# Patient Record
Sex: Male | Born: 1995 | Race: White | Hispanic: No | Marital: Single | Smoking: Never smoker
Health system: Southern US, Community
[De-identification: ages and names within clinical notes are randomized; demographics above are authoritative.]

---

## 2016-07-10 ENCOUNTER — Encounter (HOSPITAL_COMMUNITY): Payer: Self-pay | Admitting: Emergency Medicine

## 2016-07-10 ENCOUNTER — Emergency Department (HOSPITAL_COMMUNITY)
Admission: EM | Admit: 2016-07-10 | Discharge: 2016-07-10 | Disposition: A | Discharge status: Home / Self Care | Payer: Self-pay | Attending: Emergency Medicine | Admitting: Emergency Medicine

## 2016-07-10 DIAGNOSIS — R55 Syncope and collapse: Secondary | ICD-10-CM | POA: Insufficient documentation

## 2016-07-10 LAB — CBG MONITORING, ED: Glucose-Capillary: 88 mg/dL (ref 65–99)

## 2016-07-10 LAB — BASIC METABOLIC PANEL
ANION GAP: 4 — AB (ref 5–15)
BUN: 17 mg/dL (ref 6–20)
CO2: 27 mmol/L (ref 22–32)
Calcium: 9.2 mg/dL (ref 8.9–10.3)
Chloride: 107 mmol/L (ref 101–111)
Creatinine, Ser: 1 mg/dL (ref 0.61–1.24)
GFR calc Af Amer: >60 mL/min (ref >60)
GFR calc non Af Amer: >60 mL/min (ref >60)
GLUCOSE: 113 mg/dL — AB (ref 65–99)
POTASSIUM: 4.1 mmol/L (ref 3.5–5.1)
Sodium: 138 mmol/L (ref 135–145)

## 2016-07-10 LAB — CBC
HEMATOCRIT: 45.7 % (ref 39.0–52.0)
HEMOGLOBIN: 15.9 g/dL (ref 13.0–17.0)
MCH: 29.6 pg (ref 26.0–34.0)
MCHC: 34.8 g/dL (ref 30.0–36.0)
MCV: 85.1 fL (ref 78.0–100.0)
Platelets: 200 10*3/uL (ref 150–400)
RBC: 5.37 MIL/uL (ref 4.22–5.81)
RDW: 12.9 % (ref 11.5–15.5)
WBC: 7.6 10*3/uL (ref 4.0–10.5)

## 2016-07-10 NOTE — ED Provider Notes (Signed)
[facility] DEPT Provider Note   CSN: [id number] Arrival date & time: 07/10/16  0758     History   Chief Complaint Chief Complaint  Patient presents with  . Loss of Consciousness    HPI Joe Ashley is a 21 y.o. male.  Patient presents to the ED with a chief complaint of syncope.  He states that he got up this morning to use the bathroom at about 5:30am, took a few steps, became dizzy, and passed out.  He denies having any pain anywhere.  He states that he still feels a little "shaky."  He denies any chest pain or SOB.  He denies any prior medical problems, denies taking any medications, or using any alcohol or recreational drugs.  There are no other associated symptoms.   The history is provided by the patient. No language interpreter was used.    History reviewed. No pertinent past medical history.  There are no active problems to display for this patient.   History reviewed. No pertinent surgical history.     Home Medications    Prior to Admission medications   Not on File    Family History No family history on file.  Social History Social History  Substance Use Topics  . Smoking status: Never Smoker  . Smokeless tobacco: Never Used  . Alcohol use No     Allergies   Bactrim [sulfamethoxazole-trimethoprim]   Review of Systems Review of Systems  Neurological: Positive for syncope.  All other systems reviewed and are negative.    Physical Exam Updated Vital Signs BP 140/64 (BP Location: Right Arm)   Pulse 74   Temp 97.7 F (36.5 C) (Oral)   Resp 20   Ht 6\' 1"  (1.854 m)   Wt 104.3 kg   SpO2 100%   BMI 30.34 kg/m   Physical Exam  Constitutional: He is oriented to person, place, and time. He appears well-developed and well-nourished.  HENT:  Head: Normocephalic and atraumatic.  Eyes: Conjunctivae and EOM are normal. Pupils are equal, round, and reactive to light. Right eye exhibits no discharge. Left eye exhibits no discharge. No scleral  icterus.  Neck: Normal range of motion. Neck supple. No JVD present.  Cardiovascular: Normal rate, regular rhythm and normal heart sounds.  Exam reveals no gallop and no friction rub.   No murmur heard. Pulmonary/Chest: Effort normal and breath sounds normal. No respiratory distress. He has no wheezes. He has no rales. He exhibits no tenderness.  Abdominal: Soft. He exhibits no distension and no mass. There is no tenderness. There is no rebound and no guarding.  Musculoskeletal: Normal range of motion. He exhibits no edema or tenderness.  Neurological: He is alert and oriented to person, place, and time.  Skin: Skin is warm and dry.  Psychiatric: He has a normal mood and affect. His behavior is normal. Judgment and thought content normal.  Nursing note and vitals reviewed.    ED Treatments / Results  Labs (all labs ordered are listed, but only abnormal results are displayed) Labs Reviewed  BASIC METABOLIC PANEL  CBC  CBG MONITORING, ED    EKG  EKG Interpretation None       Radiology No results found.  Procedures Procedures (including critical care time)  Medications Ordered in ED Medications - No data to display   Initial Impression / Assessment and Plan / ED Course  I have reviewed the triage vital signs and the nursing notes.  Pertinent labs & imaging results that were available during  my care of the patient were reviewed by me and considered in my medical decision making (see chart for details).  Clinical Course     Patient with syncopal episode this morning.  Reports having passed out as a child, but nothing recently.  He states he feels improved, but still a little "shaky."  Will check CBG, EKG, CBC, and BMP.  Will also check orthostatics.   Based on history seems to be more of a vasovagul/orthostatic response to having just gotten up to use the bathroom.  Labs and EKG are reassuring.  EKG reviewed with Dr. Particia Nearing.  Mildly orthostatic.  Given oral fluids in  ED.  Ambulates without difficulty.  DC to home with PCP follow-up.    Final Clinical Impressions(s) / ED Diagnoses   Final diagnoses:  Syncope, unspecified syncope type    New Prescriptions New Prescriptions   No medications on file     Aaran Enberg, PA-C 07/10/16 1610    Jacalyn Lefevre, MD 07/10/16 1104

## 2016-07-10 NOTE — ED Notes (Signed)
Pt walked two laps around nursing station. No complaints. VSS.

## 2016-07-10 NOTE — ED Notes (Signed)
Pt stated that when in the bathroom, Mother caught him falling but pt never hit the floor. No injuries noted.

## 2016-07-10 NOTE — ED Triage Notes (Signed)
Pt got up around 5:30 this am to go to the bathroom and passed out. Feels dizzy and shakey

## 2016-12-30 ENCOUNTER — Emergency Department (HOSPITAL_COMMUNITY)
Admission: EM | Admit: 2016-12-30 | Discharge: 2016-12-31 | Disposition: A | Discharge status: Home / Self Care | Payer: Self-pay | Attending: Emergency Medicine | Admitting: Emergency Medicine

## 2016-12-30 ENCOUNTER — Emergency Department (HOSPITAL_COMMUNITY): Payer: Self-pay

## 2016-12-30 ENCOUNTER — Encounter (HOSPITAL_COMMUNITY): Payer: Self-pay | Admitting: Emergency Medicine

## 2016-12-30 DIAGNOSIS — R0789 Other chest pain: Secondary | ICD-10-CM | POA: Insufficient documentation

## 2016-12-30 LAB — URINALYSIS, ROUTINE W REFLEX MICROSCOPIC
Bacteria, UA: NONE SEEN
Bilirubin Urine: NEGATIVE
GLUCOSE, UA: NEGATIVE mg/dL
Hgb urine dipstick: NEGATIVE
Ketones, ur: 5 mg/dL — AB
Leukocytes, UA: NEGATIVE
Nitrite: NEGATIVE
PH: 5 (ref 5.0–8.0)
Protein, ur: 30 mg/dL — AB
Specific Gravity, Urine: 1.033 — ABNORMAL HIGH (ref 1.005–1.030)

## 2016-12-30 LAB — CBC
HCT: 44 % (ref 39.0–52.0)
Hemoglobin: 15.3 g/dL (ref 13.0–17.0)
MCH: 29.2 pg (ref 26.0–34.0)
MCHC: 34.8 g/dL (ref 30.0–36.0)
MCV: 84 fL (ref 78.0–100.0)
PLATELETS: 206 10*3/uL (ref 150–400)
RBC: 5.24 MIL/uL (ref 4.22–5.81)
RDW: 12.8 % (ref 11.5–15.5)
WBC: 8.6 10*3/uL (ref 4.0–10.5)

## 2016-12-30 LAB — COMPREHENSIVE METABOLIC PANEL
ALT: 22 U/L (ref 17–63)
AST: 18 U/L (ref 15–41)
Albumin: 4.8 g/dL (ref 3.5–5.0)
Alkaline Phosphatase: 57 U/L (ref 38–126)
Anion gap: 7 (ref 5–15)
BUN: 15 mg/dL (ref 6–20)
CALCIUM: 9.3 mg/dL (ref 8.9–10.3)
CHLORIDE: 104 mmol/L (ref 101–111)
CO2: 27 mmol/L (ref 22–32)
CREATININE: 1 mg/dL (ref 0.61–1.24)
Glucose, Bld: 94 mg/dL (ref 65–99)
Potassium: 4.1 mmol/L (ref 3.5–5.1)
Sodium: 138 mmol/L (ref 135–145)
Total Bilirubin: 0.8 mg/dL (ref 0.3–1.2)
Total Protein: 7.4 g/dL (ref 6.5–8.1)

## 2016-12-30 LAB — LIPASE, BLOOD: LIPASE: 23 U/L (ref 11–51)

## 2016-12-30 MED ORDER — GI COCKTAIL ~~LOC~~
30.0000 mL | Freq: Once | ORAL | Status: AC
  Administered 2016-12-30: 30 mL via ORAL
  Filled 2016-12-30: qty 30

## 2016-12-30 NOTE — ED Provider Notes (Signed)
[facility] Provider Note   CSN: [id number] Arrival date & time: 12/30/16  1820     History   Chief Complaint Chief Complaint  Patient presents with  . Abdominal Pain    HPI Joe Ashley is a 21 y.o. male.  Patient presents with a 2 day history of constant lower chest and upper abdominal pain. Does not radiate. States it started 2 nights ago after his infant son was awake all night crying. The pain is constant. Nothing makes it better or worse. Had one episode of nausea earlier but no vomiting. No shortness of breath, cough or fever. No diaphoresis. No diarrhea. No pain with urination. Denies any excessive alcohol or caffeine use. Denies any excessive NSAID use. Denies any history of acid reflux or GERD. Denies any history of heart problems.   The history is provided by the patient.  Abdominal Pain   Associated symptoms include nausea. Pertinent negatives include fever, vomiting, dysuria, hematuria, headaches, arthralgias and myalgias.    No past medical history on file.  There are no active problems to display for this patient.   No past surgical history on file.     Home Medications    Prior to Admission medications   Not on File    Family History No family history on file.  Social History Social History  Substance Use Topics  . Smoking status: Never Smoker  . Smokeless tobacco: Never Used  . Alcohol use Yes     Comment: monthly     Allergies   Bactrim [sulfamethoxazole-trimethoprim]   Review of Systems Review of Systems  Constitutional: Negative for activity change, appetite change and fever.  HENT: Negative for congestion and rhinorrhea.   Respiratory: Positive for chest tightness. Negative for shortness of breath.   Cardiovascular: Negative for chest pain.  Gastrointestinal: Positive for abdominal pain and nausea. Negative for vomiting.  Genitourinary: Negative for dysuria, hematuria and urgency.  Musculoskeletal: Negative for  arthralgias and myalgias.  Skin: Negative for rash.  Neurological: Negative for dizziness, weakness and headaches.   all other systems are negative except as noted in the HPI and PMH.     Physical Exam Updated Vital Signs BP (!) 141/79 (BP Location: Right Arm)   Pulse 67   Temp 97.7 F (36.5 C) (Oral)   Resp 18   Ht 6\' 1"  (1.854 m)   Wt 79.4 kg (175 lb)   SpO2 100%   BMI 23.09 kg/m   Physical Exam  Constitutional: He is oriented to person, place, and time. He appears well-developed and well-nourished. No distress.  HENT:  Head: Normocephalic and atraumatic.  Mouth/Throat: Oropharynx is clear and moist. No oropharyngeal exudate.  Eyes: Conjunctivae and EOM are normal. Pupils are equal, round, and reactive to light.  Neck: Normal range of motion. Neck supple.  No meningismus.  Cardiovascular: Normal rate, regular rhythm, normal heart sounds and intact distal pulses.   No murmur heard. Pulmonary/Chest: Effort normal and breath sounds normal. No respiratory distress. He exhibits tenderness.  Xiphoid tenderness  Abdominal: Soft. There is no tenderness. There is no rebound and no guarding.  Soft, no epigastric or RUQ tenderness  Musculoskeletal: Normal range of motion. He exhibits no edema or tenderness.  Neurological: He is alert and oriented to person, place, and time. No cranial nerve deficit. He exhibits normal muscle tone. Coordination normal.  No ataxia on finger to nose bilaterally. No pronator drift. 5/5 strength throughout. CN 2-12 intact.Equal grip strength. Sensation intact.   Skin: Skin is warm.  Psychiatric: He has a normal mood and affect. His behavior is normal.  Nursing note and vitals reviewed.    ED Treatments / Results  Labs (all labs ordered are listed, but only abnormal results are displayed) Labs Reviewed  URINALYSIS, ROUTINE W REFLEX MICROSCOPIC - Abnormal; Notable for the following:       Result Value   Specific Gravity, Urine 1.033 (*)    Ketones,  ur 5 (*)    Protein, ur 30 (*)    Squamous Epithelial / LPF 0-5 (*)    All other components within normal limits  CBC  COMPREHENSIVE METABOLIC PANEL  LIPASE, BLOOD  TROPONIN I    EKG  EKG Interpretation  Date/Time:  Tuesday December 30 2016 23:43:39 EDT Ventricular Rate:  67 PR Interval:    QRS Duration: 81 QT Interval:  379 QTC Calculation: 400 R Axis:   76 Text Interpretation:  Sinus rhythm Baseline wander in lead(s) V2 No significant change was found Confirmed by Glynn Octave [phone]) on 12/30/2016 11:53:35 PM       Radiology Dg Abdomen Acute W/chest  Result Date: 12/31/2016 CLINICAL DATA:  Epigastric pain for several days with nausea EXAM: DG ABDOMEN ACUTE W/ 1V CHEST COMPARISON:  None. FINDINGS: Single-view chest demonstrates no acute consolidation or effusion. Normal heart size. Supine and upright views of the abdomen demonstrate no free air beneath the diaphragm. Nonobstructed bowel-gas pattern. No abnormal calcification IMPRESSION: Negative abdominal radiographs.  No acute cardiopulmonary disease. Electronically Signed   By: Jasmine Pang M.D.   On: 12/31/2016 01:05    Procedures Procedures (including critical care time)  Medications Ordered in ED Medications  gi cocktail (Maalox,Lidocaine,Donnatal) (30 mLs Oral Given 12/30/16 2344)     Initial Impression / Assessment and Plan / ED Course  I have reviewed the triage vital signs and the nursing notes.  Pertinent labs & imaging results that were available during my care of the patient were reviewed by me and considered in my medical decision making (see chart for details).     Patient with upper abdominal and lower chest pain. Abdomen is soft. EKG normal sinus rhythm.  LFTs and lipase normal.  EKG nsr. Troponin negative.  Patient has reproducible lower sternal and xiphoid pain. His chest x-rays negative. His lab work is reassuring. Low suspicion for ACS or pulmonary embolism.  Trial anti-inflammatories with  PPI. Follow-up with PCP. Return precautions discussed.  Final Clinical Impressions(s) / ED Diagnoses   Final diagnoses:  Atypical chest pain    New Prescriptions New Prescriptions   No medications on file     Glynn Octave, MD 12/31/16 [phone]

## 2016-12-30 NOTE — ED Triage Notes (Signed)
Pt reports stabbing epigastric pain for 2 days.  Has had significant weight loss over the past 7 months without intent.

## 2016-12-31 LAB — TROPONIN I

## 2016-12-31 MED ORDER — OMEPRAZOLE 20 MG PO CPDR: 20.0000 mg | DELAYED_RELEASE_CAPSULE | Freq: Every day | ORAL | 0 refills | Status: AC

## 2016-12-31 NOTE — Discharge Instructions (Signed)
There is no evidence of heart attack. Take the stomach medication as prescribed. Follow up with your doctor. Return to the ED if you develop new or worsening symptoms.

## 2017-02-15 ENCOUNTER — Emergency Department (HOSPITAL_COMMUNITY): Payer: Worker's Compensation

## 2017-02-15 ENCOUNTER — Encounter (HOSPITAL_COMMUNITY): Payer: Self-pay | Admitting: Emergency Medicine

## 2017-02-15 ENCOUNTER — Emergency Department (HOSPITAL_COMMUNITY)
Admission: EM | Admit: 2017-02-15 | Discharge: 2017-02-15 | Disposition: A | Discharge status: Home / Self Care | Payer: Worker's Compensation | Attending: Emergency Medicine | Admitting: Emergency Medicine

## 2017-02-15 DIAGNOSIS — Y9289 Other specified places as the place of occurrence of the external cause: Secondary | ICD-10-CM | POA: Insufficient documentation

## 2017-02-15 DIAGNOSIS — Y939 Activity, unspecified: Secondary | ICD-10-CM | POA: Insufficient documentation

## 2017-02-15 DIAGNOSIS — Z79899 Other long term (current) drug therapy: Secondary | ICD-10-CM | POA: Insufficient documentation

## 2017-02-15 DIAGNOSIS — Y990 Civilian activity done for income or pay: Secondary | ICD-10-CM | POA: Insufficient documentation

## 2017-02-15 DIAGNOSIS — S81811A Laceration without foreign body, right lower leg, initial encounter: Secondary | ICD-10-CM | POA: Insufficient documentation

## 2017-02-15 DIAGNOSIS — W228XXA Striking against or struck by other objects, initial encounter: Secondary | ICD-10-CM | POA: Insufficient documentation

## 2017-02-15 MED ORDER — CEPHALEXIN 500 MG PO CAPS: 500.0000 mg | ORAL_CAPSULE | Freq: Four times a day (QID) | ORAL | 0 refills | Status: AC

## 2017-02-15 MED ORDER — IBUPROFEN 800 MG PO TABS
800.0000 mg | ORAL_TABLET | Freq: Once | ORAL | Status: AC
  Administered 2017-02-15: 800 mg via ORAL
  Filled 2017-02-15: qty 1

## 2017-02-15 MED ORDER — POVIDONE-IODINE 10 % EX SOLN
CUTANEOUS | Status: DC | PRN
  Filled 2017-02-15: qty 15

## 2017-02-15 NOTE — ED Notes (Signed)
Right lower leg cleaned with betadine and NS. Xerofoam applied and wrapped

## 2017-02-15 NOTE — ED Triage Notes (Signed)
Pt reports cut right anterior calf on cardboard box while at work. At time of arrival, pressure dressing applied. Minima bleeding noted at site. Site redressed and secured in place for transport to room. nad noted. Distal pulses intact.

## 2017-02-15 NOTE — Discharge Instructions (Signed)
As discussed, there is no indication for stitches for your wound.  I recommend keeping your bandage applied today in place for the next 24 hours.  Starting tomorrow evening you may  change the dressing after a gentle soap and water wash to the site.  Take the entire course of antibiotics to help prevent infection in this wound.

## 2017-02-15 NOTE — ED Provider Notes (Signed)
[facility] Provider Note   CSN: [id number] Arrival date & time: 02/15/17  1614     History   Chief Complaint Chief Complaint  Patient presents with  . Extremity Laceration    HPI Joe Ashley is a 21 y.o. male presenting With a laceration to his right anterior tibia.  He was working at a [employer] and was lacerated by a cardboard box edge that was in the freezer.  The injury occurred just prior to arrival.  The wound was cleaned with soap and water prior to arrival.  He denies any other injury and there is no radiation of pain, stating immediate pain at the laceration site only.  His tetanus is current.  HPI  History reviewed. No pertinent past medical history.  There are no active problems to display for this patient.   History reviewed. No pertinent surgical history.     Home Medications    Prior to Admission medications   Medication Sig Start Date End Date Taking? Authorizing Provider  cephALEXin (KEFLEX) 500 MG capsule Take 1 capsule (500 mg total) by mouth 4 (four) times daily. 02/15/17   Burgess Amor, PA-C  omeprazole (PRILOSEC) 20 MG capsule Take 1 capsule (20 mg total) by mouth daily. 12/31/16   Glynn Octave, MD    Family History Family History  Problem Relation Age of Onset  . Diabetes Father     Social History Social History  Substance Use Topics  . Smoking status: Never Smoker  . Smokeless tobacco: Never Used  . Alcohol use Yes     Comment: monthly     Allergies   Bactrim [sulfamethoxazole-trimethoprim]   Review of Systems Review of Systems  Constitutional: Negative for chills and fever.  Respiratory: Negative for shortness of breath and wheezing.   Skin: Positive for wound.  Neurological: Negative for numbness.     Physical Exam Updated Vital Signs BP 122/88 (BP Location: Right Arm)   Pulse 61   Temp 97.9 F (36.6 C) (Oral)   Resp 18   Ht 6\' 1"  (1.854 m)   Wt 79.4 kg (175 lb)   SpO2 99%   BMI 23.09  kg/m   Physical Exam  Constitutional: He is oriented to person, place, and time. He appears well-developed and well-nourished.  HENT:  Head: Normocephalic.  Cardiovascular: Normal rate.   Pulmonary/Chest: Effort normal.  Musculoskeletal: He exhibits tenderness.  Tender to palpation along anterior right tibia just inferior to the wound site.  There is no palpable deformity.  Neurological: He is alert and oriented to person, place, and time. No sensory deficit.  Skin: Laceration noted.  Superficial abrasion noted right mid anterior tibia.  The inferior edge of the wound has a deeper appearing puncture wound which is hemostatic.  There is no palpable foreign body.     ED Treatments / Results  Labs (all labs ordered are listed, but only abnormal results are displayed) Labs Reviewed - No data to display  EKG  EKG Interpretation None       Radiology Dg Tibia/fibula Right  Result Date: 02/15/2017 CLINICAL DATA:  Laceration of the anterior tibia. EXAM: RIGHT TIBIA AND FIBULA - 2 VIEW COMPARISON:  None. FINDINGS: The knee and ankle joints are maintained. No acute fracture is identified. No obvious radiopaque foreign body. Air in the soft tissues over the upper aspect of the anterior tibia likely from the open wound. Artifact over the mid aspect of the tibia could be from a bandage. IMPRESSION: No acute bony  findings or radiopaque foreign body. Electronically Signed   By: Rudie MeyerP.  Gallerani M.D.   On: 02/15/2017 17:53    Procedures Procedures (including critical care time)  Medications Ordered in ED Medications  povidone-iodine (BETADINE) 10 % external solution (not administered)  ibuprofen (ADVIL,MOTRIN) tablet 800 mg (800 mg Oral Given 02/15/17 1744)     Initial Impression / Assessment and Plan / ED Course  I have reviewed the triage vital signs and the nursing notes.  Pertinent labs & imaging results that were available during my care of the patient were reviewed by me and  considered in my medical decision making (see chart for details).     Wound care provided including Betadine and saline cleaning, Xeroform beneath dressing.  Wound care instructions given for home use.  Given deeper puncture wound, prophylactic Keflex was prescribed.  Ice, elevation, when necessary follow-up for any new or worsened symptoms.    Final Clinical Impressions(s) / ED Diagnoses   Final diagnoses:  Laceration of right lower extremity, initial encounter    New Prescriptions New Prescriptions   CEPHALEXIN (KEFLEX) 500 MG CAPSULE    Take 1 capsule (500 mg total) by mouth 4 (four) times daily.     Burgess Amordol, Duwane Gewirtz, PA-C 02/15/17 1800    Bethann BerkshireZammit, Joseph, MD 02/16/17 [phone]

## 2018-03-05 ENCOUNTER — Other Ambulatory Visit: Payer: Self-pay

## 2018-03-05 ENCOUNTER — Encounter (HOSPITAL_COMMUNITY): Payer: Self-pay | Admitting: Emergency Medicine

## 2018-03-05 ENCOUNTER — Emergency Department (HOSPITAL_COMMUNITY)
Admission: EM | Admit: 2018-03-05 | Discharge: 2018-03-05 | Disposition: A | Discharge status: Home / Self Care | Payer: Self-pay | Attending: Emergency Medicine | Admitting: Emergency Medicine

## 2018-03-05 ENCOUNTER — Emergency Department (HOSPITAL_COMMUNITY): Payer: Self-pay

## 2018-03-05 DIAGNOSIS — J069 Acute upper respiratory infection, unspecified: Secondary | ICD-10-CM | POA: Insufficient documentation

## 2018-03-05 LAB — GROUP A STREP BY PCR: GROUP A STREP BY PCR: NOT DETECTED

## 2018-03-05 MED ORDER — DEXAMETHASONE 4 MG PO TABS
8.0000 mg | ORAL_TABLET | Freq: Once | ORAL | Status: AC
  Administered 2018-03-05: 8 mg via ORAL
  Filled 2018-03-05: qty 2

## 2018-03-05 MED ORDER — BENZONATATE 100 MG PO CAPS: 100.0000 mg | ORAL_CAPSULE | Freq: Three times a day (TID) | ORAL | 0 refills | Status: AC | PRN

## 2018-03-05 NOTE — Discharge Instructions (Addendum)
Take over the counter tylenol and ibuprofen, as directed on packaging, as needed for discomfort.  Gargle with warm water several times per day to help with discomfort.  May also use over the counter sore throat pain medicines such as chloraseptic or sucrets, as directed on packaging, as needed for discomfort.  Take the prescription as directed for cough. Call your regular medical doctor on Monday to schedule a follow up appointment next week.  Return to the Emergency Department immediately if worsening.

## 2018-03-05 NOTE — ED Provider Notes (Signed)
[facility]GENCY DEPARTMENT Provider Note   CSN: [id number] Arrival date & time: 03/05/18  1542     History   Chief Complaint Chief Complaint  Patient presents with  . Sore Throat    HPI Joe ShaggyRobert Ashley is a 22 y.o. male.  HPI  Pt was seen at 1730.  Per pt, c/o gradual onset and persistence of constant sore throat and cough for the past 3 days.  Denies fevers, no rash, no CP/SOB, no N/V/D, no abd pain.    History reviewed. No pertinent past medical history.  There are no active problems to display for this patient.   History reviewed. No pertinent surgical history.      Home Medications    Prior to Admission medications   Medication Sig Start Date End Date Taking? Authorizing Provider  cephALEXin (KEFLEX) 500 MG capsule Take 1 capsule (500 mg total) by mouth 4 (four) times daily. 02/15/17   Burgess AmorIdol, Julie, PA-C  omeprazole (PRILOSEC) 20 MG capsule Take 1 capsule (20 mg total) by mouth daily. 12/31/16   Glynn Octaveancour, Stephen, MD    Family History Family History  Problem Relation Age of Onset  . Diabetes Father     Social History Social History   Tobacco Use  . Smoking status: Never Smoker  . Smokeless tobacco: Never Used  Substance Use Topics  . Alcohol use: Yes    Comment: monthly  . Drug use: No     Allergies   Bactrim [sulfamethoxazole-trimethoprim]   Review of Systems Review of Systems ROS: Statement: All systems negative except as marked or noted in the HPI; Constitutional: Negative for fever and chills. ; ; Eyes: Negative for eye pain, redness and discharge. ; ; ENMT: Negative for ear pain, hoarseness, nasal congestion, sinus pressure and +sore throat. ; ; Cardiovascular: Negative for chest pain, palpitations, diaphoresis, dyspnea and peripheral edema. ; ; Respiratory: +cough. Negative for wheezing and stridor. ; ; Gastrointestinal: Negative for nausea, vomiting, diarrhea, abdominal pain, blood in stool, hematemesis, jaundice and rectal bleeding. . ; ;  Genitourinary: Negative for dysuria, flank pain and hematuria. ; ; Musculoskeletal: Negative for back pain and neck pain. Negative for swelling and trauma.; ; Skin: Negative for pruritus, rash, abrasions, blisters, bruising and skin lesion.; ; Neuro: Negative for headache, lightheadedness and neck stiffness. Negative for weakness, altered level of consciousness, altered mental status, extremity weakness, paresthesias, involuntary movement, seizure and syncope.       Physical Exam Updated Vital Signs BP (!) 149/76 (BP Location: Right Arm)   Pulse 76   Temp 98.1 F (36.7 C) (Oral)   Resp 16   Ht 6' (1.829 m)   Wt 95.3 kg   SpO2 98%   BMI 28.48 kg/m   Physical Exam 1735: Physical examination:  Nursing notes reviewed; Vital signs and O2 SAT reviewed;  Constitutional: Well developed, Well nourished, Well hydrated, In no acute distress; Head:  Normocephalic, atraumatic; Eyes: EOMI, PERRL, No scleral icterus; ENMT: TM's clear bilat. +edemetous nasal turbinates bilat with clear rhinorrhea. Mouth and pharynx without lesions. No tonsillar exudates. No intra-oral edema. No submandibular or sublingual edema. No hoarse voice, no drooling, no stridor. No pain with manipulation of larynx. No trismus. Mouth and pharynx normal, Mucous membranes moist; Neck: Supple, Full range of motion, No lymphadenopathy; Cardiovascular: Regular rate and rhythm, No gallop; Respiratory: Breath sounds clear & equal bilaterally, No wheezes.  Speaking full sentences with ease, Normal respiratory effort/excursion; Chest: Nontender, Movement normal; Abdomen: Soft, Nontender, Nondistended, Normal bowel sounds;; Extremities: Peripheral  pulses normal, No tenderness, No edema, No calf edema or asymmetry.; Neuro: AA&Ox3, Major CN grossly intact.  Speech clear. No gross focal motor or sensory deficits in extremities. Climbs on and off stretcher easily by himself. Gait steady..; Skin: Color normal, Warm, Dry.; Psych:  Full affect.   ED  Treatments / Results  Labs (all labs ordered are listed, but only abnormal results are displayed)   EKG None  Radiology   Procedures Procedures (including critical care time)  Medications Ordered in ED Medications - No data to display   Initial Impression / Assessment and Plan / ED Course  I have reviewed the triage vital signs and the nursing notes.  Pertinent labs & imaging results that were available during my care of the patient were reviewed by me and considered in my medical decision making (see chart for details).  MDM Reviewed: previous chart, nursing note and vitals Interpretation: labs and x-ray   Results for orders placed or performed during the hospital encounter of 03/05/18  Group A Strep by PCR  Result Value Ref Range   Group A Strep by PCR NOT DETECTED NOT DETECTED   Dg Chest 2 View Result Date: 03/05/2018 CLINICAL DATA:  Productive cough EXAM: CHEST - 2 VIEW COMPARISON:  12/31/2016 FINDINGS: The heart size and mediastinal contours are within normal limits. Both lungs are clear. The visualized skeletal structures are unremarkable. IMPRESSION: No active cardiopulmonary disease. Electronically Signed   By: Marlan Palau M.D.   On: 03/05/2018 18:00    1805:  Workup reassuring. Tx symptomatically at this time. Dx and testing d/w pt and family.  Questions answered.  Verb understanding, agreeable to d/c home with outpt f/u.   Final Clinical Impressions(s) / ED Diagnoses   Final diagnoses:  None    ED Discharge Orders    None       Samuel Jester, DO 03/09/18 1716

## 2018-03-05 NOTE — ED Triage Notes (Signed)
Pt c/o of sore throat and productive cough x 3 days. Denies fever

## 2019-01-27 IMAGING — DX DG ABDOMEN ACUTE W/ 1V CHEST
3 series · 3 of 3 positions shown · non-contrast
Comparison: None.

CLINICAL DATA: Epigastric pain for several days with nausea

EXAM:
DG ABDOMEN ACUTE W/ 1V CHEST

[chest pa]
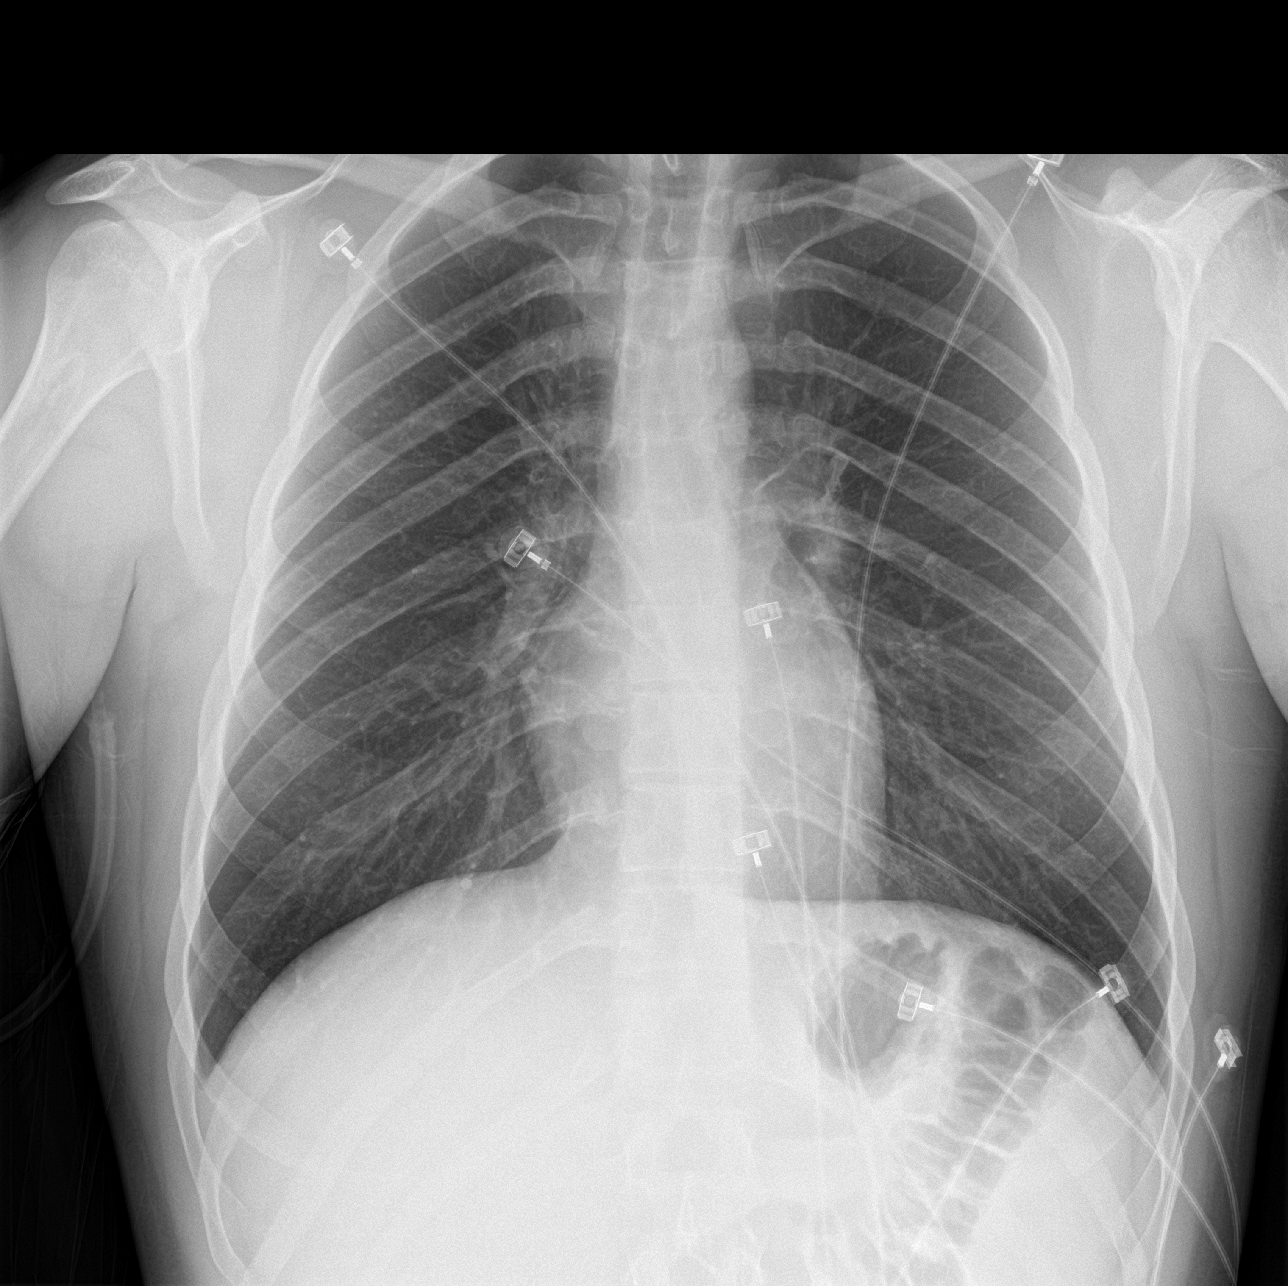

[abdomen erect]
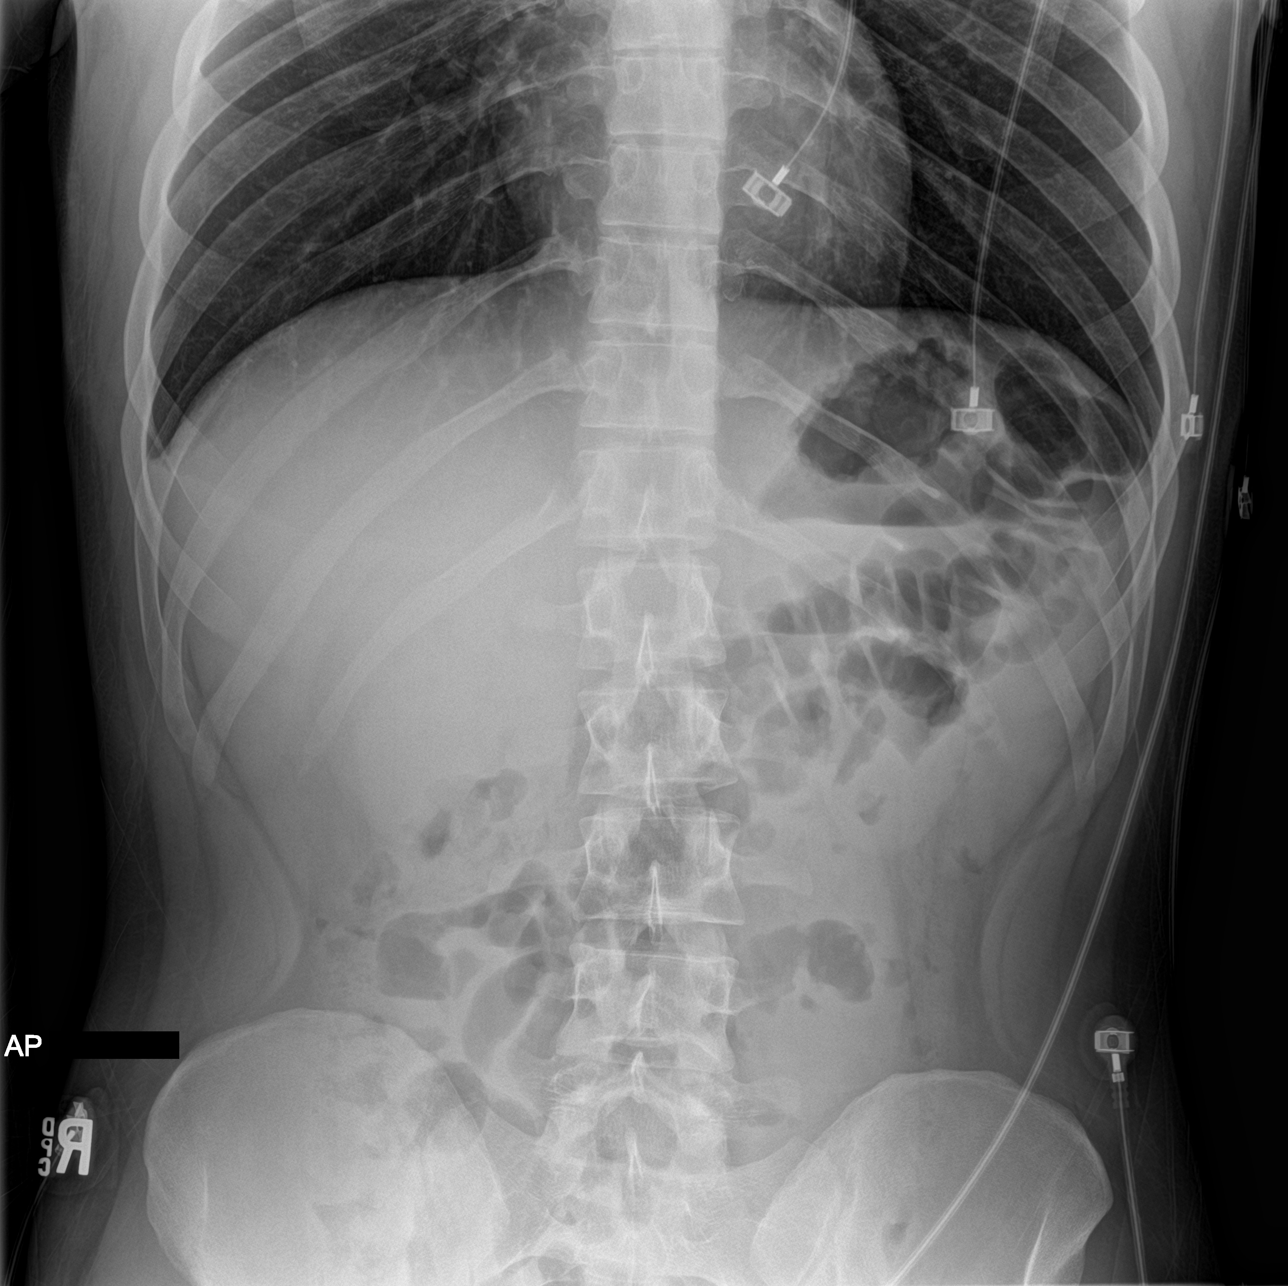

[abdomen supine]
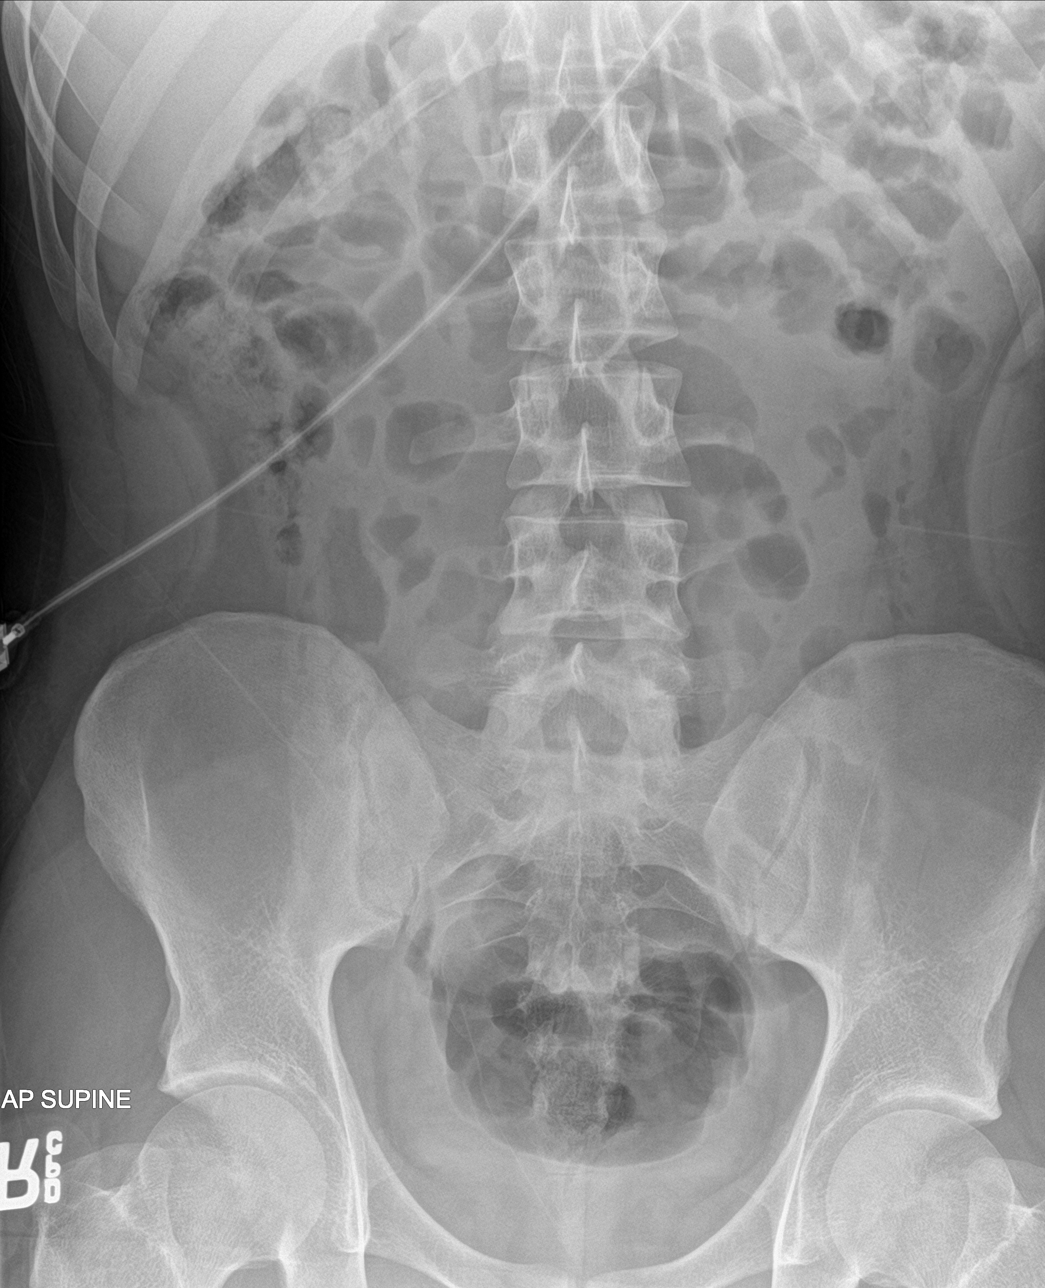

[3 of 3 positions shown; findings below may reference images not displayed]

FINDINGS: Single-view chest demonstrates no acute consolidation or effusion.
Normal heart size.

Supine and upright views of the abdomen demonstrate no free air
beneath the diaphragm. Nonobstructed bowel-gas pattern. No abnormal
calcification
IMPRESSION: Negative abdominal radiographs.  No acute cardiopulmonary disease.
